# Patient Record
Sex: Male | Born: 1935 | Hispanic: No | Marital: Married | Smoking: Never smoker
Health system: Southern US, Community
[De-identification: ages and names within clinical notes are randomized; demographics above are authoritative.]

## PROBLEM LIST (undated history)

## (undated) DIAGNOSIS — I10 Essential (primary) hypertension: Secondary | ICD-10-CM

## (undated) DIAGNOSIS — I2510 Atherosclerotic heart disease of native coronary artery without angina pectoris: Secondary | ICD-10-CM

## (undated) DIAGNOSIS — I259 Chronic ischemic heart disease, unspecified: Secondary | ICD-10-CM

## (undated) DIAGNOSIS — E785 Hyperlipidemia, unspecified: Secondary | ICD-10-CM

## (undated) DIAGNOSIS — Q245 Malformation of coronary vessels: Secondary | ICD-10-CM

## (undated) DIAGNOSIS — M1990 Unspecified osteoarthritis, unspecified site: Secondary | ICD-10-CM

## (undated) DIAGNOSIS — I4891 Unspecified atrial fibrillation: Secondary | ICD-10-CM

## (undated) HISTORY — DX: Unspecified atrial fibrillation: I48.91

## (undated) HISTORY — DX: Unspecified osteoarthritis, unspecified site: M19.90

## (undated) HISTORY — PX: OTHER SURGICAL HISTORY: SHX169

## (undated) HISTORY — PX: CORONARY ARTERY BYPASS GRAFT: SHX141

## (undated) HISTORY — DX: Hyperlipidemia, unspecified: E78.5

## (undated) HISTORY — DX: Essential (primary) hypertension: I10

## (undated) HISTORY — DX: Atherosclerotic heart disease of native coronary artery without angina pectoris: I25.10

## (undated) HISTORY — DX: Malformation of coronary vessels: Q24.5

## (undated) HISTORY — DX: Chronic ischemic heart disease, unspecified: I25.9

---

## 1997-07-28 ENCOUNTER — Other Ambulatory Visit: Admission: RE | Admit: 1997-07-28 | Discharge: 1997-07-28 | Payer: Self-pay | Admitting: Cardiology

## 2001-09-14 ENCOUNTER — Ambulatory Visit (HOSPITAL_COMMUNITY): Admission: RE | Admit: 2001-09-14 | Discharge: 2001-09-14 | Payer: Self-pay | Admitting: Cardiology

## 2002-09-13 ENCOUNTER — Encounter: Payer: Self-pay | Admitting: Urology

## 2002-09-14 ENCOUNTER — Inpatient Hospital Stay (HOSPITAL_COMMUNITY): Admission: RE | Admit: 2002-09-14 | Discharge: 2002-09-16 | Payer: Self-pay | Admitting: Urology

## 2002-11-07 ENCOUNTER — Ambulatory Visit (HOSPITAL_COMMUNITY): Admission: RE | Admit: 2002-11-07 | Discharge: 2002-11-07 | Payer: Self-pay | Admitting: Cardiology

## 2002-12-06 ENCOUNTER — Ambulatory Visit (HOSPITAL_COMMUNITY): Admission: RE | Admit: 2002-12-06 | Discharge: 2002-12-06 | Payer: Self-pay | Admitting: Cardiology

## 2002-12-06 ENCOUNTER — Encounter: Payer: Self-pay | Admitting: Cardiology

## 2002-12-07 ENCOUNTER — Encounter: Payer: Self-pay | Admitting: Cardiology

## 2002-12-07 ENCOUNTER — Ambulatory Visit (HOSPITAL_COMMUNITY): Admission: RE | Admit: 2002-12-07 | Discharge: 2002-12-07 | Payer: Self-pay | Admitting: Cardiology

## 2003-01-09 ENCOUNTER — Inpatient Hospital Stay (HOSPITAL_COMMUNITY): Admission: AD | Admit: 2003-01-09 | Discharge: 2003-01-12 | Payer: Self-pay | Admitting: Cardiology

## 2003-01-09 ENCOUNTER — Encounter: Payer: Self-pay | Admitting: Cardiology

## 2003-01-20 ENCOUNTER — Emergency Department (HOSPITAL_COMMUNITY): Admission: EM | Admit: 2003-01-20 | Discharge: 2003-01-20 | Payer: Self-pay | Admitting: Emergency Medicine

## 2003-01-20 ENCOUNTER — Encounter: Payer: Self-pay | Admitting: Emergency Medicine

## 2003-01-28 ENCOUNTER — Inpatient Hospital Stay (HOSPITAL_COMMUNITY): Admission: EM | Admit: 2003-01-28 | Discharge: 2003-02-04 | Payer: Self-pay | Admitting: Emergency Medicine

## 2003-01-31 ENCOUNTER — Encounter: Payer: Self-pay | Admitting: Internal Medicine

## 2003-02-01 ENCOUNTER — Encounter: Payer: Self-pay | Admitting: Cardiology

## 2003-02-23 ENCOUNTER — Ambulatory Visit (HOSPITAL_COMMUNITY): Admission: RE | Admit: 2003-02-23 | Discharge: 2003-02-23 | Payer: Self-pay | Admitting: Internal Medicine

## 2003-02-27 ENCOUNTER — Encounter: Admission: RE | Admit: 2003-02-27 | Discharge: 2003-02-27 | Payer: Self-pay | Admitting: Internal Medicine

## 2010-05-04 ENCOUNTER — Encounter: Payer: Self-pay | Admitting: Internal Medicine

## 2010-05-16 ENCOUNTER — Ambulatory Visit (INDEPENDENT_AMBULATORY_CARE_PROVIDER_SITE_OTHER): Payer: Medicare Other | Admitting: Cardiology

## 2010-05-16 DIAGNOSIS — Z7901 Long term (current) use of anticoagulants: Secondary | ICD-10-CM

## 2010-05-16 DIAGNOSIS — I4891 Unspecified atrial fibrillation: Secondary | ICD-10-CM

## 2010-05-16 DIAGNOSIS — I253 Aneurysm of heart: Secondary | ICD-10-CM

## 2010-05-24 ENCOUNTER — Ambulatory Visit
Admission: RE | Admit: 2010-05-24 | Discharge: 2010-05-24 | Disposition: A | Discharge status: Home / Self Care | Payer: BC Managed Care – PPO | Source: Ambulatory Visit | Attending: Otolaryngology | Admitting: Otolaryngology

## 2010-05-24 ENCOUNTER — Other Ambulatory Visit: Payer: Self-pay | Admitting: Otolaryngology

## 2010-05-24 MED ORDER — GADOBENATE DIMEGLUMINE 529 MG/ML IV SOLN
14.0000 mL | Freq: Once | INTRAVENOUS | Status: AC | PRN
  Administered 2010-05-24: 14 mL via INTRAVENOUS

## 2010-09-13 ENCOUNTER — Other Ambulatory Visit: Payer: Self-pay | Admitting: *Deleted

## 2010-09-13 ENCOUNTER — Encounter: Payer: Self-pay | Admitting: Cardiology

## 2010-09-13 DIAGNOSIS — E785 Hyperlipidemia, unspecified: Secondary | ICD-10-CM

## 2010-09-16 ENCOUNTER — Other Ambulatory Visit (INDEPENDENT_AMBULATORY_CARE_PROVIDER_SITE_OTHER): Payer: BC Managed Care – PPO | Admitting: *Deleted

## 2010-09-16 ENCOUNTER — Ambulatory Visit (INDEPENDENT_AMBULATORY_CARE_PROVIDER_SITE_OTHER): Payer: BC Managed Care – PPO | Admitting: Cardiology

## 2010-09-16 ENCOUNTER — Encounter: Payer: Self-pay | Admitting: Cardiology

## 2010-09-16 ENCOUNTER — Ambulatory Visit (INDEPENDENT_AMBULATORY_CARE_PROVIDER_SITE_OTHER): Payer: BC Managed Care – PPO | Admitting: *Deleted

## 2010-09-16 DIAGNOSIS — E785 Hyperlipidemia, unspecified: Secondary | ICD-10-CM

## 2010-09-16 DIAGNOSIS — I4891 Unspecified atrial fibrillation: Secondary | ICD-10-CM

## 2010-09-16 DIAGNOSIS — I2510 Atherosclerotic heart disease of native coronary artery without angina pectoris: Secondary | ICD-10-CM

## 2010-09-16 LAB — LIPID PANEL
Cholesterol: 128 mg/dL (ref 0–200)
HDL: 54 mg/dL (ref >39.00)
Triglycerides: 65 mg/dL (ref 0.0–149.0)

## 2010-09-16 LAB — HEPATIC FUNCTION PANEL
ALT: 22 U/L (ref 0–53)
AST: 35 U/L (ref 0–37)
Albumin: 4.3 g/dL (ref 3.5–5.2)
Alkaline Phosphatase: 56 U/L (ref 39–117)
Total Protein: 7.2 g/dL (ref 6.0–8.3)

## 2010-09-16 LAB — BASIC METABOLIC PANEL
Calcium: 8.7 mg/dL (ref 8.4–10.5)
Chloride: 98 mEq/L (ref 96–112)
Creatinine, Ser: 0.9 mg/dL (ref 0.4–1.5)
Sodium: 133 mEq/L — ABNORMAL LOW (ref 135–145)

## 2010-09-16 NOTE — Progress Notes (Signed)
Subjective:   Adrian Austin is seen today for followup visit. In general, he's continued to do well and recently has played golf. He's not had any chest pain. He has a history of atrial fibrillation with controlled ventricular response and has done well on his Coumadin anticoagulation. 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 He does have a history of hypertension is controlled as well as mild osteoarthritis  Current Outpatient Prescriptions  Medication Sig Dispense Refill  . alendronate (FOSAMAX) 70 MG tablet Take 70 mg by mouth every 7 (seven) days. Take with a full glass of water on an empty stomach.       . ezetimibe (ZETIA) 10 MG tablet Take 10 mg by mouth daily.        . furosemide (LASIX) 80 MG tablet Take 80 mg by mouth as needed.        . lansoprazole (PREVACID) 30 MG capsule Take 30 mg by mouth daily.        . metoprolol (TOPROL-XL) 50 MG 24 hr tablet Take 50 mg by mouth daily. 2 IN THE AM, 1 IN THE PM       . Multiple Vitamin (MULTIVITAMIN) tablet Take 1 tablet by mouth daily.        . potassium chloride SA (K-DUR,KLOR-CON) 20 MEQ tablet Take 20 mEq by mouth 2 (two) times daily.        . rosuvastatin (CRESTOR) 5 MG tablet Take 5 mg by mouth daily.        Marland Kitchen warfarin (COUMADIN) 5 MG tablet Take 5 mg by mouth as directed.          Allergies  Allergen Reactions  . Lipitor (Atorvastatin Calcium)   . Mevacor (Lovastatin)   . Penicillins   . Pravachol   . Sulfa Drugs Cross Reactors     Patient Active Problem List  Diagnoses  . Atrial fibrillation    History  Smoking status  . Never Smoker   Smokeless tobacco  . Never Used    History  Alcohol Use No    Family History  Problem Relation Age of Onset  . Colon cancer Mother   . Heart disease Father     Review of Systems:   The patient denies any heat or cold intolerance.  No weight gain or weight loss.  The patient denies headaches or blurry  vision.  There is no cough or sputum production.  The patient denies dizziness.  There is no hematuria or hematochezia.  The patient denies any muscle aches or arthritis.  The patient denies any rash.  The patient denies frequent falling or instability.  There is no history of depression or anxiety.  All other systems were reviewed and are negative.   Physical Exam:   Vital signs are reviewed.The head is normocephalic and atraumatic.  Pupils are equally round and reactive to light.  Sclerae nonicteric.  Conjunctiva is clear.  Oropharynx is unremarkable.  There's adequate oral airway.  Neck is supple there are no masses.  Thyroid is not enlarged.  There is no lymphadenopathy.  Lungs are clear.  Chest is symmetric.  Heart shows an  irregular rate and rhythm.  S1 and S2 are normal.  There is no murmur click or gallop.  Abdomen is soft normal bowel sounds.  There is no organomegaly.  Genital and rectal deferred.  Extremities are without edema.  Peripheral pulses are adequate.  Neurologically intact.  Full range  of motion.  The patient is not depressed.  Skin is warm and dry.  Assessment / Plan:

## 2010-09-17 DIAGNOSIS — I2510 Atherosclerotic heart disease of native coronary artery without angina pectoris: Secondary | ICD-10-CM | POA: Insufficient documentation

## 2010-09-17 NOTE — Assessment & Plan Note (Signed)
I'll have him see Dr. Swaziland in 6 months. Overall, he does amazingly well. He's had some mild hyponatremia in the past but now has normal renal function.

## 2010-09-17 NOTE — Assessment & Plan Note (Signed)
He remains in atrial fibrillation with controlled ventricular response and he manages his warfarin at home. Currently takes approximately 14 mg of warfarin per week and has been able to keep his INR in approximately the 2.0-2.5 range.

## 2010-09-18 ENCOUNTER — Telehealth: Payer: Self-pay | Admitting: *Deleted

## 2010-09-18 NOTE — Telephone Encounter (Signed)
Message copied by Lorayne Bender on Wed Sep 18, 2010 11:32 AM ------      Message from: Roger Shelter      Created: Wed Sep 18, 2010  8:42 AM       Ok, recheck in six months.

## 2010-09-18 NOTE — Telephone Encounter (Signed)
Notified of lab results. Will be followed by Dr. Swaziland in Dec and will recheck labs then.

## 2011-04-28 ENCOUNTER — Encounter: Payer: Self-pay | Admitting: Cardiology

## 2011-04-28 ENCOUNTER — Ambulatory Visit (INDEPENDENT_AMBULATORY_CARE_PROVIDER_SITE_OTHER): Payer: BC Managed Care – PPO | Admitting: Cardiology

## 2011-04-28 VITALS — BP 140/84 | HR 83 | Ht 70.0 in | Wt 166.4 lb

## 2011-04-28 DIAGNOSIS — Z951 Presence of aortocoronary bypass graft: Secondary | ICD-10-CM

## 2011-04-28 DIAGNOSIS — I10 Essential (primary) hypertension: Secondary | ICD-10-CM | POA: Insufficient documentation

## 2011-04-28 DIAGNOSIS — E785 Hyperlipidemia, unspecified: Secondary | ICD-10-CM

## 2011-04-28 DIAGNOSIS — I4891 Unspecified atrial fibrillation: Secondary | ICD-10-CM

## 2011-04-28 DIAGNOSIS — I2510 Atherosclerotic heart disease of native coronary artery without angina pectoris: Secondary | ICD-10-CM

## 2011-04-28 NOTE — Assessment & Plan Note (Signed)
His rate is well controlled and he is asymptomatic. He is on Coumadin chronically but it concerns me that he adjusts his dose on his on and doesn't have his blood checked as frequently as he should. I stressed the importance of regular followup. He does this with his primary care in [place].

## 2011-04-28 NOTE — Assessment & Plan Note (Signed)
He remains asymptomatic. I would not recommend a routine stress testing given his advanced age. Continue with his medical therapy and risk factor modification. I'll followup again in 6 months and we will check fasting lab work at that time.

## 2011-04-28 NOTE — Assessment & Plan Note (Signed)
Blood pressure is well controlled on current medications. 

## 2011-04-28 NOTE — Progress Notes (Signed)
Subjective:   Adrian Austin is seen today to establish cardiac care. He is a former patient of Dr. Deborah Chalk. In general, he's continued to do well. He's not had any chest pain. He has a history of atrial fibrillation with controlled ventricular response and has done well on his Coumadin anticoagulation. He does not get his INRs checked regularly. He does have it checked periodically with his primary physician in [place]. He has not had recurrent chest pain. 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 His LAD and left circumflex coronary arise anomalously from the right coronary cusp. He does have a small intermediate branch which arises from the left coronary cusp. His bypass surgery included a saphenous vein graft to the diagonal, saphenous vein graft to the left circumflex, LIMA graft to the LAD, and saphenous vein graft to the acute marginal of the right coronary. The same graft was occluded to the distal right coronary. He does have a history of hypertension is controlled as well as mild osteoarthritis. His son-in-law is Dr. Darvin Neighbours.  Current Outpatient Prescriptions  Medication Sig Dispense Refill  . alendronate (FOSAMAX) 70 MG tablet Take 70 mg by mouth every 7 (seven) days. Take with a full glass of water on an empty stomach.       . ezetimibe (ZETIA) 10 MG tablet Take 10 mg by mouth daily.        . furosemide (LASIX) 80 MG tablet Take 80 mg by mouth as needed.        . lansoprazole (PREVACID) 30 MG capsule Take 30 mg by mouth daily.        . metoprolol (TOPROL-XL) 50 MG 24 hr tablet Take 50 mg by mouth daily. 2 IN THE AM, 1 IN THE PM       . Multiple Vitamin (MULTIVITAMIN) tablet Take 1 tablet by mouth daily.        . potassium chloride SA (K-DUR,KLOR-CON) 20 MEQ tablet Take 20 mEq by mouth 2 (two) times daily.        . rosuvastatin (CRESTOR) 5 MG tablet Take 5 mg by mouth daily.        Marland Kitchen warfarin (COUMADIN) 5 MG tablet Take 5 mg by mouth as directed.           Allergies  Allergen Reactions  . Lipitor (Atorvastatin Calcium)   . Mevacor (Lovastatin)   . Penicillins   . Pravachol   . Sulfa Drugs Cross Reactors     Patient Active Problem List  Diagnoses  . Atrial fibrillation  . CAD (coronary artery disease)    History  Smoking status  . Never Smoker   Smokeless tobacco  . Never Used    History  Alcohol Use No    Family History  Problem Relation Age of Onset  . Colon cancer Mother   . Heart disease Father     Review of Systems:   The patient denies any heat or cold intolerance.  No weight gain or weight loss.  The patient denies headaches or blurry vision.  There is no cough or sputum production.  The patient denies dizziness.  There is no hematuria or hematochezia.  The patient denies any muscle aches or arthritis.  The patient denies any rash.  The patient denies frequent falling or instability.  There is no history of depression or anxiety.  All other systems were reviewed and are negative.   Physical Exam:   Vital signs are reviewed.The head is normocephalic and  atraumatic.  Pupils are equally round and reactive to light.  Sclerae nonicteric.  Conjunctiva is clear.  Oropharynx is unremarkable.  There's adequate oral airway.  Neck is supple there are no masses.  Thyroid is not enlarged.  There is no lymphadenopathy.  Lungs are clear.  Chest is symmetric.  Heart shows an  irregular rate and rhythm.  S1 and S2 are normal.  There is no murmur click or gallop.  Abdomen is soft normal bowel sounds.  There is no organomegaly.  Genital and rectal deferred.  Extremities are without edema.  Peripheral pulses are adequate.  Neurologically intact.  Full range of motion.  The patient is not depressed.  Skin is warm and dry.  Laboratory data: ECG demonstrates atrial fibrillation with frequent PVCs. 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 This is unchanged compared  to May of 2010. Assessment / Plan:

## 2011-04-28 NOTE — Assessment & Plan Note (Signed)
Blood work in June of 2012 looks satisfactory. We will plan on checking fasting lab work on his next visit.

## 2011-04-28 NOTE — Patient Instructions (Signed)
Continue your current medications.  I will see you again in 6 months with fasting.

## 2011-10-27 ENCOUNTER — Other Ambulatory Visit: Payer: BC Managed Care – PPO

## 2012-01-14 ENCOUNTER — Encounter: Payer: Self-pay | Admitting: Cardiology

## 2012-01-14 ENCOUNTER — Other Ambulatory Visit: Payer: Self-pay

## 2012-01-14 ENCOUNTER — Ambulatory Visit (INDEPENDENT_AMBULATORY_CARE_PROVIDER_SITE_OTHER): Payer: BC Managed Care – PPO | Admitting: Cardiology

## 2012-01-14 VITALS — BP 118/58 | HR 55 | Ht 70.0 in | Wt 161.8 lb

## 2012-01-14 DIAGNOSIS — I4891 Unspecified atrial fibrillation: Secondary | ICD-10-CM

## 2012-01-14 DIAGNOSIS — E785 Hyperlipidemia, unspecified: Secondary | ICD-10-CM

## 2012-01-14 DIAGNOSIS — I10 Essential (primary) hypertension: Secondary | ICD-10-CM

## 2012-01-14 DIAGNOSIS — I2510 Atherosclerotic heart disease of native coronary artery without angina pectoris: Secondary | ICD-10-CM

## 2012-01-14 NOTE — Progress Notes (Signed)
Subjective:   Adrian Austin is seen today for 6 month followup.  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 His LAD and left circumflex coronary arise anomalously from the right coronary cusp. He does have a small intermediate branch which arises from the left coronary cusp. His bypass surgery included a saphenous vein graft to the diagonal, saphenous vein graft to the left circumflex, LIMA graft to the LAD, and saphenous vein graft to the acute marginal of the right coronary. The same graft was occluded to the distal right coronary. He does have a history of hypertension is controlled as well as mild osteoarthritis. He has a history of atrial fibrillation and has been on chronic Coumadin. He has this checked sporadically by his primary care physician in [place]. His son-in-law is Dr. Darvin Neighbours. He currently denies any chest pain, palpitations, shortness of breath, or increased edema. He states he is taking his Lasix daily.  Current Outpatient Prescriptions  Medication Sig Dispense Refill  . alendronate (FOSAMAX) 70 MG tablet Take 70 mg by mouth every 7 (seven) days. Take with a full glass of water on an empty stomach.       . ezetimibe (ZETIA) 10 MG tablet Take 10 mg by mouth daily.        . furosemide (LASIX) 80 MG tablet Take 80 mg by mouth as needed.        . lansoprazole (PREVACID) 30 MG capsule Take 30 mg by mouth daily.        . metoprolol (TOPROL-XL) 50 MG 24 hr tablet Take 50 mg by mouth daily. 2 IN THE AM, 1 IN THE PM       . Multiple Vitamin (MULTIVITAMIN) tablet Take 1 tablet by mouth daily.        . potassium chloride SA (K-DUR,KLOR-CON) 20 MEQ tablet Take 20 mEq by mouth 2 (two) times daily.        . rosuvastatin (CRESTOR) 5 MG tablet Take 5 mg by mouth daily.        Marland Kitchen warfarin (COUMADIN) 5 MG tablet Take 5 mg by mouth as directed.          Allergies  Allergen Reactions  . Lipitor (Atorvastatin Calcium)   . Mevacor (Lovastatin)   . Penicillins    . Pravachol   . Sulfa Drugs Cross Reactors     Patient Active Problem List  Diagnosis  . Atrial fibrillation  . CAD (coronary artery disease)  . Hyperlipidemia  . HTN (hypertension)    History  Smoking status  . Never Smoker   Smokeless tobacco  . Never Used    History  Alcohol Use No    Family History  Problem Relation Age of Onset  . Colon cancer Mother   . Heart disease Father     Review of Systems:   The review of systems is positive for chronic right knee pain due to arthritis.  All other systems were reviewed and are negative.   Physical Exam:   BP 118/58  Pulse 55  Ht 5\' 10"  (1.778 m)  Wt 161 lb 12.8 oz (73.392 kg)  BMI 23.22 kg/m2  SpO2 98% The head is normocephalic and atraumatic.  Pupils are equally round and reactive to light.  Sclerae nonicteric.  Conjunctiva is clear.  Oropharynx is unremarkable.  There's adequate oral airway.  Neck is supple there are no masses.  Thyroid is not enlarged.  There is no lymphadenopathy.  Lungs are clear.  Chest is  symmetric.  Heart shows an  irregular rate and rhythm.  S1 and S2 are normal.  There is no murmur click or gallop.  Abdomen is soft normal bowel sounds.   Extremities are without edema.  Peripheral pulses are adequate.  Neurologically intact.  Full range of motion.  The patient is not depressed.  Skin is warm and dry.  Laboratory data:    Assessment / Plan: 1. Coronary disease status post CABG for anomalous coronary anatomy. Patient remains asymptomatic.  2. Atrial fibrillation. Rate is well controlled. He is on Coumadin. Have encouraged him to get his INR checked regularly.  3. Hypertension, controlled.  4. Hyperlipidemia. Patient will return tomorrow for fasting lab work.  5. Arthritis. I told him it was okay for him to take Tylenol as needed.

## 2012-01-14 NOTE — Patient Instructions (Signed)
We will check fasting lab work and call the results.  I will see you in 6 months.

## 2012-01-15 ENCOUNTER — Other Ambulatory Visit (INDEPENDENT_AMBULATORY_CARE_PROVIDER_SITE_OTHER): Payer: BC Managed Care – PPO

## 2012-01-15 DIAGNOSIS — Z951 Presence of aortocoronary bypass graft: Secondary | ICD-10-CM

## 2012-01-15 DIAGNOSIS — I4891 Unspecified atrial fibrillation: Secondary | ICD-10-CM

## 2012-01-15 DIAGNOSIS — E785 Hyperlipidemia, unspecified: Secondary | ICD-10-CM

## 2012-01-15 DIAGNOSIS — I2510 Atherosclerotic heart disease of native coronary artery without angina pectoris: Secondary | ICD-10-CM

## 2012-01-15 LAB — CBC WITH DIFFERENTIAL/PLATELET
Basophils Relative: 0.5 % (ref 0.0–3.0)
Eosinophils Absolute: 0.2 10*3/uL (ref 0.0–0.7)
Lymphocytes Relative: 22 % (ref 12.0–46.0)
MCHC: 33.1 g/dL (ref 30.0–36.0)
MCV: 93.6 fl (ref 78.0–100.0)
Monocytes Absolute: 0.8 10*3/uL (ref 0.1–1.0)
Neutrophils Relative %: 64.9 % (ref 43.0–77.0)
Platelets: 178 10*3/uL (ref 150.0–400.0)
RBC: 4.96 Mil/uL (ref 4.22–5.81)
WBC: 8.4 10*3/uL (ref 4.5–10.5)

## 2012-01-15 LAB — BASIC METABOLIC PANEL
Chloride: 99 mEq/L (ref 96–112)
GFR: 91.95 mL/min (ref >60.00)
Glucose, Bld: 102 mg/dL — ABNORMAL HIGH (ref 70–99)
Potassium: 4.2 mEq/L (ref 3.5–5.1)
Sodium: 132 mEq/L — ABNORMAL LOW (ref 135–145)

## 2012-01-15 LAB — HEPATIC FUNCTION PANEL
ALT: 20 U/L (ref 0–53)
AST: 35 U/L (ref 0–37)
Total Bilirubin: 1.5 mg/dL — ABNORMAL HIGH (ref 0.3–1.2)
Total Protein: 7.4 g/dL (ref 6.0–8.3)

## 2012-01-15 LAB — LIPID PANEL
Cholesterol: 164 mg/dL (ref 0–200)
HDL: 47.4 mg/dL (ref >39.00)
Triglycerides: 55 mg/dL (ref 0.0–149.0)

## 2012-01-16 ENCOUNTER — Other Ambulatory Visit: Payer: Self-pay

## 2012-06-10 ENCOUNTER — Other Ambulatory Visit: Payer: Self-pay | Admitting: *Deleted

## 2012-06-10 MED ORDER — EZETIMIBE 10 MG PO TABS: 10.0000 mg | ORAL_TABLET | Freq: Every day | ORAL | Status: DC

## 2012-06-10 MED ORDER — POTASSIUM CHLORIDE CRYS ER 20 MEQ PO TBCR: 20.0000 meq | EXTENDED_RELEASE_TABLET | Freq: Two times a day (BID) | ORAL | Status: DC

## 2012-06-10 MED ORDER — ROSUVASTATIN CALCIUM 5 MG PO TABS: 5.0000 mg | ORAL_TABLET | Freq: Every day | ORAL | Status: DC

## 2012-09-24 ENCOUNTER — Other Ambulatory Visit: Payer: Self-pay | Admitting: *Deleted

## 2012-09-24 MED ORDER — POTASSIUM CHLORIDE CRYS ER 20 MEQ PO TBCR: 20.0000 meq | EXTENDED_RELEASE_TABLET | Freq: Two times a day (BID) | ORAL | Status: DC

## 2012-11-24 ENCOUNTER — Encounter: Payer: Self-pay | Admitting: Cardiology

## 2012-11-24 ENCOUNTER — Other Ambulatory Visit: Payer: Self-pay

## 2012-11-24 ENCOUNTER — Ambulatory Visit (INDEPENDENT_AMBULATORY_CARE_PROVIDER_SITE_OTHER): Payer: BC Managed Care – PPO | Admitting: Cardiology

## 2012-11-24 VITALS — BP 130/78 | HR 71 | Ht 70.0 in | Wt 150.4 lb

## 2012-11-24 DIAGNOSIS — I2510 Atherosclerotic heart disease of native coronary artery without angina pectoris: Secondary | ICD-10-CM

## 2012-11-24 DIAGNOSIS — I10 Essential (primary) hypertension: Secondary | ICD-10-CM

## 2012-11-24 DIAGNOSIS — I4891 Unspecified atrial fibrillation: Secondary | ICD-10-CM

## 2012-11-24 DIAGNOSIS — E785 Hyperlipidemia, unspecified: Secondary | ICD-10-CM

## 2012-11-24 LAB — BASIC METABOLIC PANEL
BUN: 17 mg/dL (ref 6–23)
Calcium: 9.2 mg/dL (ref 8.4–10.5)
GFR: 88.09 mL/min (ref >60.00)
Potassium: 4.3 mEq/L (ref 3.5–5.1)
Sodium: 131 mEq/L — ABNORMAL LOW (ref 135–145)

## 2012-11-24 LAB — CBC WITH DIFFERENTIAL/PLATELET
Basophils Absolute: 0 10*3/uL (ref 0.0–0.1)
Eosinophils Absolute: 0.1 10*3/uL (ref 0.0–0.7)
Lymphocytes Relative: 16.1 % (ref 12.0–46.0)
Lymphs Abs: 1.6 10*3/uL (ref 0.7–4.0)
MCHC: 33.2 g/dL (ref 30.0–36.0)
Monocytes Relative: 8.8 % (ref 3.0–12.0)
Platelets: 189 10*3/uL (ref 150.0–400.0)
RDW: 13.7 % (ref 11.5–14.6)

## 2012-11-24 LAB — LIPID PANEL
Cholesterol: 176 mg/dL (ref 0–200)
VLDL: 12.4 mg/dL (ref 0.0–40.0)

## 2012-11-24 LAB — HEPATIC FUNCTION PANEL
ALT: 17 U/L (ref 0–53)
AST: 30 U/L (ref 0–37)
Bilirubin, Direct: 0.2 mg/dL (ref 0.0–0.3)
Total Bilirubin: 1.5 mg/dL — ABNORMAL HIGH (ref 0.3–1.2)
Total Protein: 7.7 g/dL (ref 6.0–8.3)

## 2012-11-24 LAB — PROTIME-INR: Prothrombin Time: 12.1 s (ref 10.2–12.4)

## 2012-11-24 MED ORDER — APIXABAN 5 MG PO TABS: 5.0000 mg | ORAL_TABLET | Freq: Two times a day (BID) | ORAL | Status: DC

## 2012-11-24 NOTE — Progress Notes (Signed)
Subjective:   Adrian Austin is seen today for 6 month followup.  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 His LAD and left circumflex coronary arise anomalously from the right coronary cusp. He does have a small intermediate branch which arises from the left coronary cusp. His bypass surgery included a saphenous vein graft to the diagonal, saphenous vein graft to the left circumflex, LIMA graft to the LAD, and saphenous vein graft to the acute marginal of the right coronary. The same graft was occluded to the distal right coronary. He does have a history of hypertension is controlled as well as mild osteoarthritis. He has a history of atrial fibrillation and has been on chronic Coumadin.  His son-in-law is Dr. Darvin Austin. He currently denies any chest pain, palpitations, shortness of breath, or increased edema. According to his daughter he is not getting his Coumadin checked.  Current Outpatient Prescriptions  Medication Sig Dispense Refill  . alendronate (FOSAMAX) 70 MG tablet Take 70 mg by mouth every 7 (seven) days. Take with a full glass of water on an empty stomach.       . ezetimibe (ZETIA) 10 MG tablet Take 1 tablet (10 mg total) by mouth daily.  30 tablet  2  . furosemide (LASIX) 40 MG tablet Take 40 mg daily if needed  30 tablet  6  . lansoprazole (PREVACID) 30 MG capsule Take 30 mg by mouth daily.        . metoprolol (TOPROL-XL) 50 MG 24 hr tablet Take 50 mg by mouth daily. 2 IN THE AM, 1 IN THE PM       . Multiple Vitamin (MULTIVITAMIN) tablet Take 1 tablet by mouth daily.        . potassium chloride SA (K-DUR,KLOR-CON) 20 MEQ tablet Take 1 tablet (20 mEq total) by mouth 2 (two) times daily.  60 tablet  2  . rosuvastatin (CRESTOR) 5 MG tablet Take 1 tablet (5 mg total) by mouth daily.  30 tablet  2  . apixaban (ELIQUIS) 5 MG TABS tablet Take 1 tablet (5 mg total) by mouth 2 (two) times daily.  60 tablet  11   No current facility-administered  medications for this visit.    Allergies  Allergen Reactions  . Lipitor [Atorvastatin Calcium]   . Mevacor [Lovastatin]   . Penicillins   . Pravachol   . Sulfa Drugs Cross Reactors     Patient Active Problem List   Diagnosis Date Noted  . Hyperlipidemia 04/28/2011  . HTN (hypertension) 04/28/2011  . CAD (coronary artery disease) 09/17/2010  . Atrial fibrillation 09/16/2010    History  Smoking status  . Never Smoker   Smokeless tobacco  . Never Used    History  Alcohol Use No    Family History  Problem Relation Age of Onset  . Colon cancer Mother   . Heart disease Father     Review of Systems:   The review of systems is positive for chronic right knee pain due to arthritis. His daughter reports that his gait is less daily. He is hard of hearing. All other systems were reviewed and are negative.   Physical Exam:   BP 130/78  Pulse 71  Ht 5\' 10"  (1.778 m)  Wt 150 lb 6.4 oz (68.221 kg)  BMI 21.58 kg/m2 The head is normocephalic and atraumatic.  Pupils are equally round and reactive to light.  Sclerae nonicteric.  Conjunctiva is clear.  Oropharynx is unremarkable.  There's adequate  oral airway.  Neck is supple there are no masses.  Thyroid is not enlarged.  There is no lymphadenopathy.  Lungs are clear.  Chest is symmetric.  Heart shows an  irregular rate and rhythm.  S1 and S2 are normal.  There is no murmur click or gallop.  Abdomen is soft normal bowel sounds.   Extremities are without edema.  Peripheral pulses are adequate.  Neurologically intact.  Full range of motion.  The patient is not depressed.  Skin is warm and dry.  Laboratory data:  ECG today demonstrates atrial fibrillation with a rate of 71 beats per minute. He has ST-T wave changes consistent with lateral ischemia. There is left axis deviation and possible old septal infarct.  Assessment / Plan: 1. Coronary disease status post CABG for anomalous coronary anatomy. Patient remains asymptomatic.  2.  Atrial fibrillation. Rate is well controlled. He is on Coumadin. He has not had regular followup. We will check his INR today. I recommended switching him to a novel anticoagulant and we'll start him on Eliquis 5 mg twice a day depending on his INR level today.  3. Hypertension, controlled.  4. Hyperlipidemia. We'll check fasting lab work today.  5. Arthritis.

## 2012-11-24 NOTE — Patient Instructions (Signed)
We will check lab work today.  We will instruct you when to stop coumadin and start Eliquis 5 mg bid  Continue your other therapy

## 2012-12-20 ENCOUNTER — Telehealth: Payer: Self-pay | Admitting: Cardiology

## 2012-12-20 NOTE — Telephone Encounter (Signed)
Have you received a form from [employer] regarding my father's  Eliquis.   Please give me a call.

## 2012-12-20 NOTE — Telephone Encounter (Signed)
Returned call to patient's daughter Lanora Manis she stated she wanted to make sure [employer] faxed a prior authorization today.Message sent to Addison Lank RN.

## 2012-12-21 ENCOUNTER — Telehealth: Payer: Self-pay | Admitting: Cardiology

## 2012-12-21 NOTE — Telephone Encounter (Signed)
New Problem   Pt request a call back to discuss medications .

## 2012-12-21 NOTE — Telephone Encounter (Signed)
Spoke with [facility], they will not approve eliquis over phone, they will fax me form to be completed.

## 2012-12-21 NOTE — Telephone Encounter (Signed)
Returned call to patient's daughter Adrian Austin she stated father's insurance requires a form to be completed before they will approve Eliquis.[place] [employer] form completed and faxed to [phone].When reviewing patient's chart noticed 2 electronic charts for patient.[facility] was notified.

## 2012-12-23 ENCOUNTER — Other Ambulatory Visit: Payer: Self-pay | Admitting: *Deleted

## 2012-12-23 DIAGNOSIS — I4891 Unspecified atrial fibrillation: Secondary | ICD-10-CM

## 2012-12-23 MED ORDER — APIXABAN 5 MG PO TABS: 5.0000 mg | ORAL_TABLET | Freq: Two times a day (BID) | ORAL | Status: DC

## 2012-12-23 MED ORDER — POTASSIUM CHLORIDE CRYS ER 20 MEQ PO TBCR: 20.0000 meq | EXTENDED_RELEASE_TABLET | Freq: Two times a day (BID) | ORAL | Status: DC

## 2012-12-23 MED ORDER — EZETIMIBE 10 MG PO TABS: 10.0000 mg | ORAL_TABLET | Freq: Every day | ORAL | Status: DC

## 2012-12-23 NOTE — Telephone Encounter (Signed)
Adrian Austin to complete these forms

## 2013-01-18 ENCOUNTER — Other Ambulatory Visit: Payer: Self-pay | Admitting: *Deleted

## 2013-01-18 MED ORDER — FUROSEMIDE 40 MG PO TABS: ORAL_TABLET | ORAL | Status: DC

## 2013-01-26 ENCOUNTER — Other Ambulatory Visit: Payer: Self-pay

## 2013-01-26 MED ORDER — ROSUVASTATIN CALCIUM 5 MG PO TABS: 5.0000 mg | ORAL_TABLET | Freq: Every day | ORAL | Status: DC

## 2013-02-15 ENCOUNTER — Other Ambulatory Visit: Payer: Self-pay | Admitting: *Deleted

## 2013-02-15 MED ORDER — METOPROLOL SUCCINATE ER 50 MG PO TB24: 50.0000 mg | ORAL_TABLET | Freq: Every day | ORAL | Status: DC

## 2013-08-02 ENCOUNTER — Other Ambulatory Visit: Payer: Self-pay

## 2013-08-02 MED ORDER — FUROSEMIDE 40 MG PO TABS: ORAL_TABLET | ORAL | Status: DC

## 2013-08-29 ENCOUNTER — Other Ambulatory Visit: Payer: Self-pay | Admitting: *Deleted

## 2013-08-29 MED ORDER — ROSUVASTATIN CALCIUM 5 MG PO TABS: 5.0000 mg | ORAL_TABLET | Freq: Every day | ORAL | Status: DC

## 2013-08-29 MED ORDER — METOPROLOL SUCCINATE ER 50 MG PO TB24: ORAL_TABLET | ORAL | Status: DC

## 2013-11-18 ENCOUNTER — Other Ambulatory Visit: Payer: Self-pay

## 2013-11-18 ENCOUNTER — Other Ambulatory Visit: Payer: Self-pay | Admitting: Cardiology

## 2013-11-18 MED ORDER — METOPROLOL SUCCINATE ER 50 MG PO TB24: ORAL_TABLET | ORAL | Status: DC

## 2013-11-18 MED ORDER — FUROSEMIDE 40 MG PO TABS: ORAL_TABLET | ORAL | Status: DC

## 2013-11-18 MED ORDER — METOPROLOL TARTRATE 50 MG PO TABS: ORAL_TABLET | ORAL | Status: DC

## 2013-11-18 MED ORDER — ROSUVASTATIN CALCIUM 5 MG PO TABS: 5.0000 mg | ORAL_TABLET | Freq: Every day | ORAL | Status: AC

## 2013-11-18 MED ORDER — APIXABAN 5 MG PO TABS: 5.0000 mg | ORAL_TABLET | Freq: Two times a day (BID) | ORAL | Status: DC

## 2013-11-18 MED ORDER — METOPROLOL SUCCINATE ER 50 MG PO TB24: ORAL_TABLET | ORAL | Status: AC

## 2013-11-18 MED ORDER — ROSUVASTATIN CALCIUM 5 MG PO TABS: 5.0000 mg | ORAL_TABLET | Freq: Every day | ORAL | Status: DC

## 2013-11-18 MED ORDER — EZETIMIBE 10 MG PO TABS: 10.0000 mg | ORAL_TABLET | Freq: Every day | ORAL | Status: DC

## 2013-11-18 MED ORDER — POTASSIUM CHLORIDE CRYS ER 20 MEQ PO TBCR: 20.0000 meq | EXTENDED_RELEASE_TABLET | Freq: Two times a day (BID) | ORAL | Status: DC

## 2013-11-18 MED ORDER — EZETIMIBE 10 MG PO TABS: 10.0000 mg | ORAL_TABLET | Freq: Every day | ORAL | Status: AC

## 2013-11-18 MED ORDER — FUROSEMIDE 40 MG PO TABS
40.0000 mg | ORAL_TABLET | Freq: Every day | ORAL | Status: AC | PRN
Start: 2013-11-18 — End: ?

## 2013-11-18 MED ORDER — POTASSIUM CHLORIDE ER 20 MEQ PO TBCR: 20.0000 meq | EXTENDED_RELEASE_TABLET | Freq: Two times a day (BID) | ORAL | Status: DC

## 2014-01-09 ENCOUNTER — Other Ambulatory Visit: Payer: Self-pay | Admitting: *Deleted

## 2014-01-09 MED ORDER — APIXABAN 5 MG PO TABS: 5.0000 mg | ORAL_TABLET | Freq: Two times a day (BID) | ORAL | Status: AC

## 2014-04-03 ENCOUNTER — Other Ambulatory Visit: Payer: Self-pay | Admitting: Cardiology

## 2015-04-06 ENCOUNTER — Emergency Department (HOSPITAL_COMMUNITY)
Admission: EM | Admit: 2015-04-06 | Discharge: 2015-04-06 | Disposition: A | Discharge status: Home / Self Care | Payer: Medicare Other | Attending: Emergency Medicine | Admitting: Emergency Medicine

## 2015-04-06 ENCOUNTER — Encounter (HOSPITAL_COMMUNITY): Payer: Self-pay

## 2015-04-06 ENCOUNTER — Emergency Department (HOSPITAL_COMMUNITY): Payer: Medicare Other

## 2015-04-06 DIAGNOSIS — I2510 Atherosclerotic heart disease of native coronary artery without angina pectoris: Secondary | ICD-10-CM | POA: Diagnosis not present

## 2015-04-06 DIAGNOSIS — S0990XA Unspecified injury of head, initial encounter: Secondary | ICD-10-CM

## 2015-04-06 DIAGNOSIS — Z79899 Other long term (current) drug therapy: Secondary | ICD-10-CM | POA: Diagnosis not present

## 2015-04-06 DIAGNOSIS — Z880 Allergy status to penicillin: Secondary | ICD-10-CM | POA: Insufficient documentation

## 2015-04-06 DIAGNOSIS — S0101XA Laceration without foreign body of scalp, initial encounter: Secondary | ICD-10-CM | POA: Diagnosis not present

## 2015-04-06 DIAGNOSIS — Z23 Encounter for immunization: Secondary | ICD-10-CM | POA: Diagnosis not present

## 2015-04-06 DIAGNOSIS — I10 Essential (primary) hypertension: Secondary | ICD-10-CM | POA: Diagnosis not present

## 2015-04-06 DIAGNOSIS — Y92129 Unspecified place in nursing home as the place of occurrence of the external cause: Secondary | ICD-10-CM | POA: Insufficient documentation

## 2015-04-06 DIAGNOSIS — R55 Syncope and collapse: Secondary | ICD-10-CM | POA: Insufficient documentation

## 2015-04-06 DIAGNOSIS — I4891 Unspecified atrial fibrillation: Secondary | ICD-10-CM | POA: Diagnosis not present

## 2015-04-06 DIAGNOSIS — W19XXXA Unspecified fall, initial encounter: Secondary | ICD-10-CM | POA: Diagnosis not present

## 2015-04-06 DIAGNOSIS — Q245 Malformation of coronary vessels: Secondary | ICD-10-CM | POA: Diagnosis not present

## 2015-04-06 DIAGNOSIS — Z951 Presence of aortocoronary bypass graft: Secondary | ICD-10-CM | POA: Insufficient documentation

## 2015-04-06 DIAGNOSIS — Y999 Unspecified external cause status: Secondary | ICD-10-CM | POA: Insufficient documentation

## 2015-04-06 DIAGNOSIS — F0390 Unspecified dementia without behavioral disturbance: Secondary | ICD-10-CM | POA: Diagnosis not present

## 2015-04-06 DIAGNOSIS — Y9389 Activity, other specified: Secondary | ICD-10-CM | POA: Diagnosis not present

## 2015-04-06 DIAGNOSIS — Z8639 Personal history of other endocrine, nutritional and metabolic disease: Secondary | ICD-10-CM | POA: Diagnosis not present

## 2015-04-06 DIAGNOSIS — Z7901 Long term (current) use of anticoagulants: Secondary | ICD-10-CM | POA: Diagnosis not present

## 2015-04-06 DIAGNOSIS — M1711 Unilateral primary osteoarthritis, right knee: Secondary | ICD-10-CM | POA: Diagnosis not present

## 2015-04-06 DIAGNOSIS — IMO0002 Reserved for concepts with insufficient information to code with codable children: Secondary | ICD-10-CM

## 2015-04-06 MED ORDER — TETANUS-DIPHTH-ACELL PERTUSSIS 5-2.5-18.5 LF-MCG/0.5 IM SUSP
0.5000 mL | Freq: Once | INTRAMUSCULAR | Status: AC
  Administered 2015-04-06: 0.5 mL via INTRAMUSCULAR
  Filled 2015-04-06: qty 0.5

## 2015-04-06 MED ORDER — LIDOCAINE-EPINEPHRINE (PF) 2 %-1:200000 IJ SOLN
10.0000 mL | Freq: Once | INTRAMUSCULAR | Status: AC
  Administered 2015-04-06: 10 mL
  Filled 2015-04-06: qty 20

## 2015-04-06 NOTE — ED Notes (Signed)
Pt was able to ambulate in hallway with walker with steady gait, stand by assist.

## 2015-04-06 NOTE — ED Notes (Signed)
Dr. Rancour at bedside. 

## 2015-04-06 NOTE — ED Provider Notes (Signed)
..  Laceration Repair Date/Time: 04/06/2015 8:45 PM Performed by: Arthor CaptainHARRIS, Adrian Austin Authorized by: Arthor CaptainHARRIS, Adrian Austin Consent: Verbal consent obtained. Consent given by: guardian Patient identity confirmed: provided demographic data Time out: Immediately prior to procedure a "time out" was called to verify the correct patient, procedure, equipment, support staff and site/side marked as required. Body area: head/neck Location details: scalp Laceration length: 3 cm Foreign bodies: no foreign bodies Tendon involvement: none Nerve involvement: none Vascular damage: no Irrigation solution: saline Amount of cleaning: standard Debridement: none Skin closure: staples Number of sutures: 2 Patient tolerance: Patient tolerated the procedure well with no immediate complications     Arthor Captainbigail Ansleigh Safer, PA-C 04/07/15 [id number]  Adrian OctaveStephen Rancour, MD 04/07/15 [phone]

## 2015-04-06 NOTE — Discharge Instructions (Signed)
Head Injury, Adult Follow-up for suture removal in one week. Return to the ED sooner if you develop worsening headache, confusion, vomiting or any other concerns. You have received a head injury. It does not appear serious at this time. Headaches and vomiting are common following head injury. It should be easy to awaken from sleeping. Sometimes it is necessary for you to stay in the emergency department for a while for observation. Sometimes admission to the hospital may be needed. After injuries such as yours, most problems occur within the first 24 hours, but side effects may occur up to 7-10 days after the injury. It is important for you to carefully monitor your condition and contact your health care provider or seek immediate medical care if there is a change in your condition. WHAT ARE THE TYPES OF HEAD INJURIES? Head injuries can be as minor as a bump. Some head injuries can be more severe. More severe head injuries include:  A jarring injury to the brain (concussion).  A bruise of the brain (contusion). This mean there is bleeding in the brain that can cause swelling.  A cracked skull (skull fracture).  Bleeding in the brain that collects, clots, and forms a bump (hematoma). WHAT CAUSES A HEAD INJURY? A serious head injury is most likely to happen to someone who is in a car wreck and is not wearing a seat belt. Other causes of major head injuries include bicycle or motorcycle accidents, sports injuries, and falls. HOW ARE HEAD INJURIES DIAGNOSED? A complete history of the event leading to the injury and your current symptoms will be helpful in diagnosing head injuries. Many times, pictures of the brain, such as CT or MRI are needed to see the extent of the injury. Often, an overnight hospital stay is necessary for observation.  WHEN SHOULD I SEEK IMMEDIATE MEDICAL CARE?  You should get help right away if:  You have confusion or drowsiness.  You feel sick to your stomach (nauseous) or  have continued, forceful vomiting.  You have dizziness or unsteadiness that is getting worse.  You have severe, continued headaches not relieved by medicine. Only take over-the-counter or prescription medicines for pain, fever, or discomfort as directed by your health care provider.  You do not have normal function of the arms or legs or are unable to walk.  You notice changes in the black spots in the center of the colored part of your eye (pupil).  You have a clear or bloody fluid coming from your nose or ears.  You have a loss of vision. During the next 24 hours after the injury, you must stay with someone who can watch you for the warning signs. This person should contact local emergency services (911 in the [place] if you have seizures, you become unconscious, or you are unable to wake up. HOW CAN I PREVENT A HEAD INJURY IN THE FUTURE? The most important factor for preventing major head injuries is avoiding motor vehicle accidents. To minimize the potential for damage to your head, it is crucial to wear seat belts while riding in motor vehicles. Wearing helmets while bike riding and playing collision sports (like football) is also helpful. Also, avoiding dangerous activities around the house will further help reduce your risk of head injury.  WHEN CAN I RETURN TO NORMAL ACTIVITIES AND ATHLETICS? You should be reevaluated by your health care provider before returning to these activities. If you have any of the following symptoms, you should not return to activities or contact  sports until 1 week after the symptoms have stopped:  Persistent headache.  Dizziness or vertigo.  Poor attention and concentration.  Confusion.  Memory problems.  Nausea or vomiting.  Fatigue or tire easily.  Irritability.  Intolerant of bright lights or loud noises.  Anxiety or depression.  Disturbed sleep. MAKE SURE YOU:   Understand these instructions.  Will watch your condition.  Will get  help right away if you are not doing well or get worse.   This information is not intended to replace advice given to you by your health care provider. Make sure you discuss any questions you have with your health care provider.   Document Released: 03/31/2005 Document Revised: 04/21/2014 Document Reviewed: 12/06/2012 [employer] Interactive Patient Education [employer]2016 [employer]

## 2015-04-06 NOTE — ED Notes (Signed)
PER EMS: pt from [facility], sent here due to unwitnessed fall, found on floor next to refrigerator sitting on his bottom leaning against the wall. Pt has dementia and is alert at baseline. Pt denies LOC. Staff stated it appeared as if he was attempting to get ice from the freezer and the freezer door swung back and hit him in the right side of the back of the head causing a small laceration and hematoma, scant bleeding. Pt is on Eliquis. BP-126/92, HR-86, CBG-150.

## 2015-04-06 NOTE — ED Provider Notes (Signed)
CSN: [id number]     Arrival date & time 04/06/15  1909 History   First MD Initiated Contact with Patient 04/06/15 1922     Chief Complaint  Patient presents with  . Fall     (Consider location/radiation/quality/duration/timing/severity/associated sxs/prior Treatment) HPI Comments: Level V caveat for dementia. Patient sent from nursing home after unwitnessed fall. Found at the refrigerator. Has laceration to his right posterior scalp. Unknown loss of consciousness. He is on a Eliquis history of atrial fibrillation. Patient complains of some pain under his laceration site but no other pain. Denies neck, back, chest, abdominal pain or hip pain. -Daughter confirms he is at his baseline mentation.   The history is provided by the patient and the EMS personnel. The history is limited by the condition of the patient.    Past Medical History  Diagnosis Date  . Controlled atrial fibrillation (HCC)   . IHD (ischemic heart disease)   . Arthritis     right knee  . Hyperlipidemia   . Hypertension   . Anomalous coronary artery origin   . CAD (coronary artery disease)    Past Surgical History  Procedure Laterality Date  . Coronary artery bypass graft    . Cataract implants     Family History  Problem Relation Age of Onset  . Colon cancer Mother   . Heart disease Father    Social History  Substance Use Topics  . Smoking status: Never Smoker   . Smokeless tobacco: Never Used  . Alcohol Use: No    Review of Systems  Unable to perform ROS: Dementia  Constitutional: Negative for fever, activity change and appetite change.  Respiratory: Negative for cough, chest tightness and shortness of breath.       Allergies  Lipitor; Mevacor; Penicillins; Pravachol; and Sulfa drugs cross reactors  Home Medications   Prior to Admission medications   Medication Sig Start Date End Date Taking? Authorizing Provider  apixaban (ELIQUIS) 5 MG TABS tablet Take 1 tablet (5 mg total) by mouth 2 (two)  times daily. 01/09/14  Yes Peter M [place], MD  b complex-vitamin c-folic acid (NEPHRO-VITE) 0.8 MG TABS tablet Take 1 tablet by mouth at bedtime.   Yes Historical Provider, MD  furosemide (LASIX) 40 MG tablet Take 1 tablet (40 mg total) by mouth daily as needed. 11/18/13  Yes Peter M [place], MD  metoprolol succinate (TOPROL-XL) 100 MG 24 hr tablet Take 100 mg by mouth 2 (two) times daily. Take with or immediately following a meal. Take every day per [facility]   Yes Historical Provider, MD  potassium chloride SA (K-DUR,KLOR-CON) 20 MEQ tablet TAKE 1 TABLET BY MOUTH TWICE DAILY 04/04/14  Yes Peter M [place], MD  traMADol (ULTRAM) 50 MG tablet Take 50 mg by mouth every 6 (six) hours as needed for moderate pain.   Yes Historical Provider, MD  triamcinolone cream (KENALOG) 0.1 % Apply 1 application topically 2 (two) times daily as needed. For rash   Yes Historical Provider, MD  ezetimibe (ZETIA) 10 MG tablet Take 1 tablet (10 mg total) by mouth daily. Patient not taking: Reported on 04/06/2015 11/18/13   Peter M [place], MD  metoprolol succinate (TOPROL-XL) 50 MG 24 hr tablet 2 IN THE AM, 1 IN THE PM Patient not taking: Reported on 04/06/2015 11/18/13   Peter M [place], MD  rosuvastatin (CRESTOR) 5 MG tablet Take 1 tablet (5 mg total) by mouth daily. Patient not taking: Reported on 04/06/2015 11/18/13   Peter M [place], MD  BP 135/75 mmHg  Pulse 71  Temp(Src) 97.9 F (36.6 C) (Oral)  Resp 18  SpO2 94% Physical Exam  Constitutional: He is oriented to person, place, and time. He appears well-developed and well-nourished. No distress.  HENT:  Head: Normocephalic and atraumatic.  Mouth/Throat: Oropharynx is clear and moist. No oropharyngeal exudate.  2 cm horizontal laceration to the right occiput. Bleeding controlled.  Eyes: Conjunctivae and EOM are normal. Pupils are equal, round, and reactive to light.  Neck: Normal range of motion. Neck supple.  No C-spine tenderness  Cardiovascular: Normal rate, regular  rhythm, normal heart sounds and intact distal pulses.   No murmur heard. Pulmonary/Chest: Effort normal and breath sounds normal. No respiratory distress.  Abdominal: Soft. There is no tenderness. There is no rebound and no guarding.  Musculoskeletal: Normal range of motion. He exhibits no edema or tenderness.  No T or L spine tenderness FROM hips without pain  Neurological: He is alert and oriented to person, place, and time. No cranial nerve deficit. He exhibits normal muscle tone. Coordination normal.  No ataxia on finger to nose bilaterally. No pronator drift. 5/5 strength throughout. CN 2-12 intact.Equal grip strength. Sensation intact.   Skin: Skin is warm.  Psychiatric: He has a normal mood and affect. His behavior is normal.  Nursing note and vitals reviewed.   ED Course  Procedures (including critical care time) Labs Review Labs Reviewed - No data to display  Imaging Review Dg Chest 2 View  04/06/2015  CLINICAL DATA:  PER EMS: pt from [facility], sent here due to unwitnessed fall, found on floor next to refrigerator sitting on his bottom leaning against the wall. Pt has dementia and is alert at baseline. Pt denies LOC. EXAM: CHEST - 2 VIEW COMPARISON:  None available FINDINGS: Previous CABG. Mild cardiomegaly. Coarse interstitial and airspace opacities in posterior left lower lobe. Mild interstitial prominence at the right lung base. No effusion. No pneumothorax. Prominent spurring in the mid thoracic spine. IMPRESSION: 1. Coarse left lower lobe interstitial and airspace opacities of uncertain chronicity. 2. Mild cardiomegaly, post CABG. Electronically Signed   By: Corlis Leak  Hassell M.D.   On: 04/06/2015 20:06   Ct Head Wo Contrast  04/06/2015  CLINICAL DATA:  Unwitnessed fall in the nursing facility. Occipital laceration and dementia. EXAM: CT HEAD WITHOUT CONTRAST CT CERVICAL SPINE WITHOUT CONTRAST TECHNIQUE: Multidetector CT imaging of the head and cervical spine was  performed following the standard protocol without intravenous contrast. Multiplanar CT image reconstructions of the cervical spine were also generated. COMPARISON:  None. FINDINGS: CT HEAD FINDINGS There is chronic diffuse atrophy. Chronic bilateral periventricular white matter small vessel ischemic change is identified. There is no midline shift, hydrocephalus, or mass. No acute hemorrhage or acute transcortical infarct is noted. The bony calvarium is intact. The visualized sinuses are clear. CT CERVICAL SPINE FINDINGS There is no acute fracture or dislocation. There are extensive degenerative joint changes of cervical spine with narrowed joint space and osteophyte formation. The prevertebral soft tissues are normal. The visualized lung apices are clear. IMPRESSION: Chronic diffuse atrophy. Chronic bilateral periventricular white matter small vessel ischemic change. No focal acute intracranial abnormality identified. No acute fracture or dislocation of cervical spine. Degenerative joint changes of cervical spine. Electronically Signed   By: Sherian ReinWei-Chen  Lin M.D.   On: 04/06/2015 21:14   Ct Cervical Spine Wo Contrast  04/06/2015  CLINICAL DATA:  Unwitnessed fall in the nursing facility. Occipital laceration and dementia. EXAM: CT HEAD WITHOUT  CONTRAST CT CERVICAL SPINE WITHOUT CONTRAST TECHNIQUE: Multidetector CT imaging of the head and cervical spine was performed following the standard protocol without intravenous contrast. Multiplanar CT image reconstructions of the cervical spine were also generated. COMPARISON:  None. FINDINGS: CT HEAD FINDINGS There is chronic diffuse atrophy. Chronic bilateral periventricular white matter small vessel ischemic change is identified. There is no midline shift, hydrocephalus, or mass. No acute hemorrhage or acute transcortical infarct is noted. The bony calvarium is intact. The visualized sinuses are clear. CT CERVICAL SPINE FINDINGS There is no acute fracture or dislocation.  There are extensive degenerative joint changes of cervical spine with narrowed joint space and osteophyte formation. The prevertebral soft tissues are normal. The visualized lung apices are clear. IMPRESSION: Chronic diffuse atrophy. Chronic bilateral periventricular white matter small vessel ischemic change. No focal acute intracranial abnormality identified. No acute fracture or dislocation of cervical spine. Degenerative joint changes of cervical spine. Electronically Signed   By: Sherian Rein M.D.   On: 04/06/2015 21:14   I have personally reviewed and evaluated these images and lab results as part of my medical decision-making.   EKG Interpretation   Date/Time:  Friday April 06 2015 22:32:09 EST Ventricular Rate:  74 PR Interval:    QRS Duration: 120 QT Interval:  423 QTC Calculation: 469 R Axis:   -58 Text Interpretation:  Atrial fibrillation Incomplete left bundle branch  block Inferior infarct, old Consider anterior infarct No previous ECGs  available Confirmed by Francena Zender  MD, Dmitry Macomber [phone]) on 04/06/2015 10:40:14  PM      MDM   Final diagnoses:  Head injury, initial encounter  Laceration   Unwitnessed fall with head laceration on anticoagulation. Neurologically intact. No chest pain or shortness of breath. At mental status baseline per daughter at bedside.  CT head and C-spine negative for acute pathology. Laceration repaired by Manuela Neptune. Tetanus updated.  Patient tolerating by mouth and ambulatory. Discussed slight risk of delayed head bleeding due to anticoagulation with patient and daughter. Advised follow-up with PCP in one week for suture and staple removal. Return to the ED sooner with worsening headache, vomiting, mental status change or any other concerns.    Glynn Octave, MD 04/06/15 [phone]

## 2015-06-13 DEATH — deceased

## 2017-02-21 IMAGING — CT CT CERVICAL SPINE W/O CM
2 series · 10 of 14 positions shown, 12 images · non-contrast
Comparison: None.

CLINICAL DATA: Unwitnessed fall in the nursing facility. Occipital
laceration and dementia.

EXAM:
CT HEAD WITHOUT CONTRAST
CT CERVICAL SPINE WITHOUT CONTRAST
TECHNIQUE: Multidetector CT imaging of the head and cervical spine was
performed following the standard protocol without intravenous
contrast. Multiplanar CT image reconstructions of the cervical spine
were also generated.

[Series 2: head 5.0 h30s · axial · 0.43mm/px · z∈[-98,-48]mm · 2 of 31 slices shown]
[im 11/31  bone]
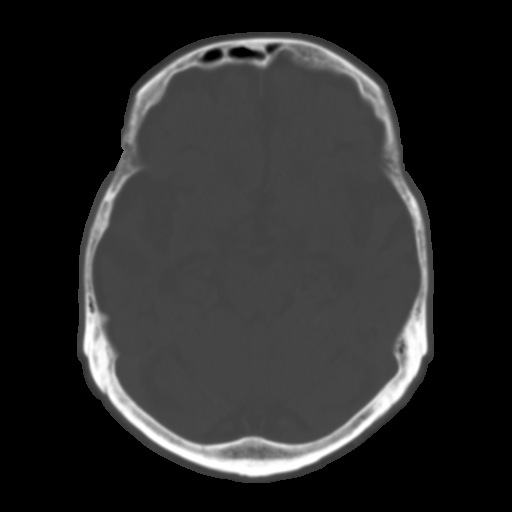
[im 21/31  bone]
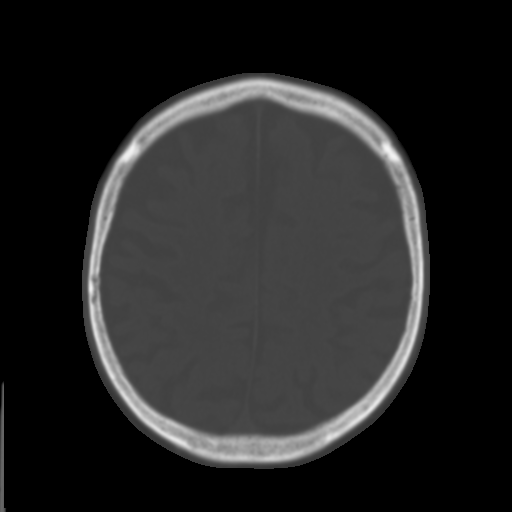

[Series 3: head 2.0 h70h · axial · 0.43mm/px · z∈[-132,-12]mm · 8 of 78 slices shown, 10 images]
[im 9/78  soft-tissue]
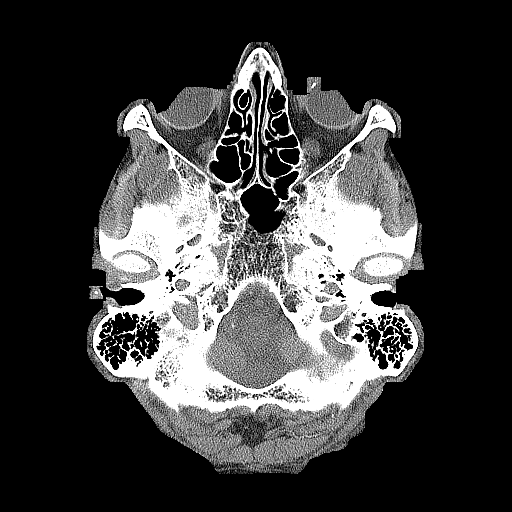
[im 9/78  bone]
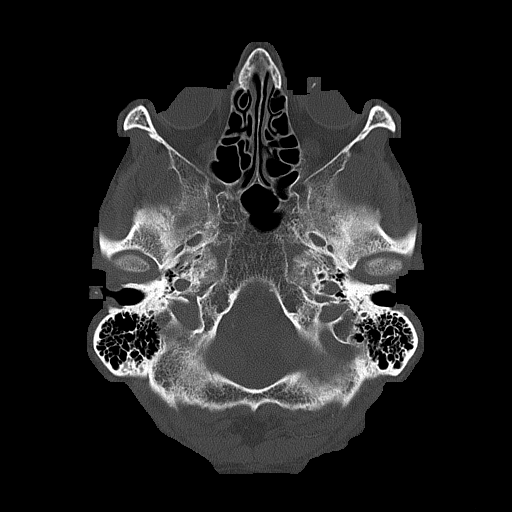
[im 18/78  bone]
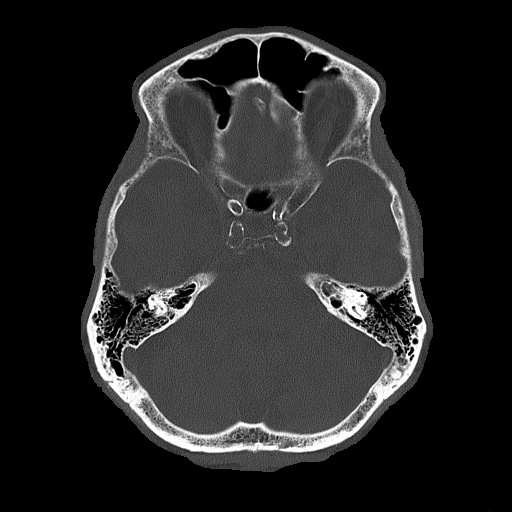
[im 26/78  bone]
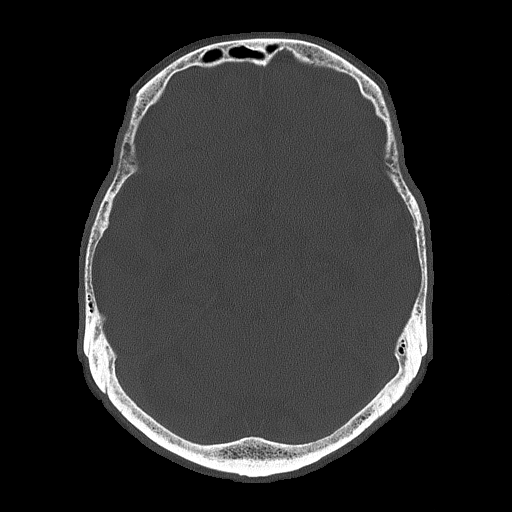
[im 35/78  bone]
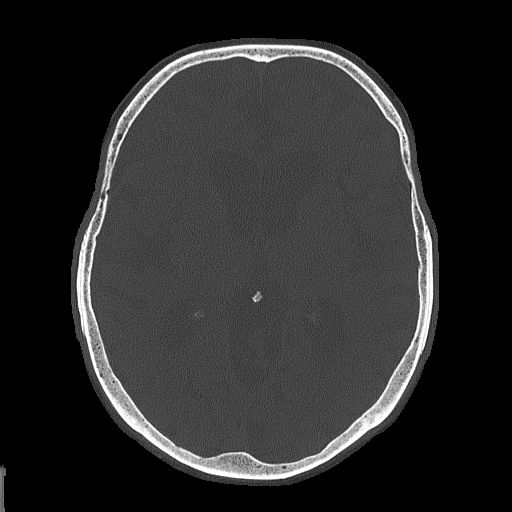
[im 43/78  soft-tissue]
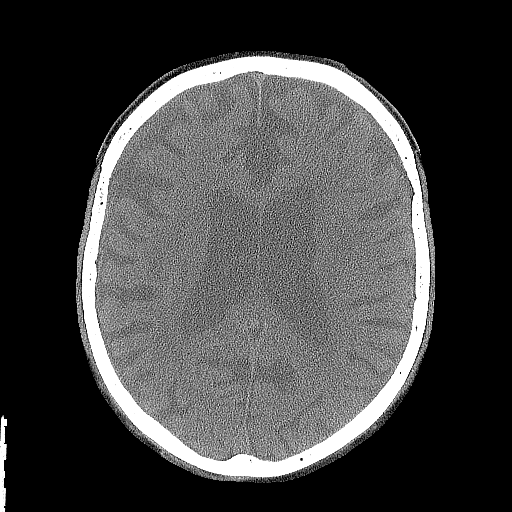
[im 43/78  bone]
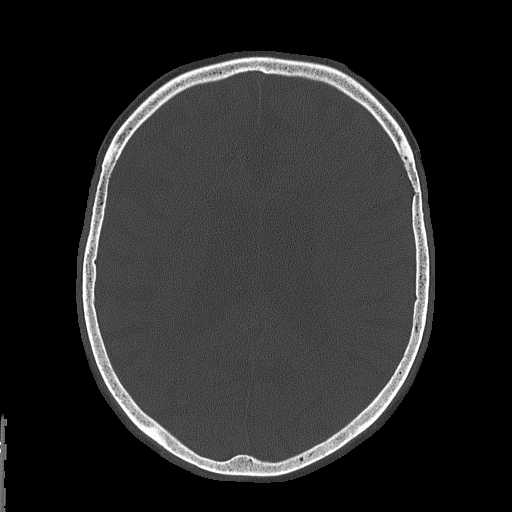
[im 52/78  bone]
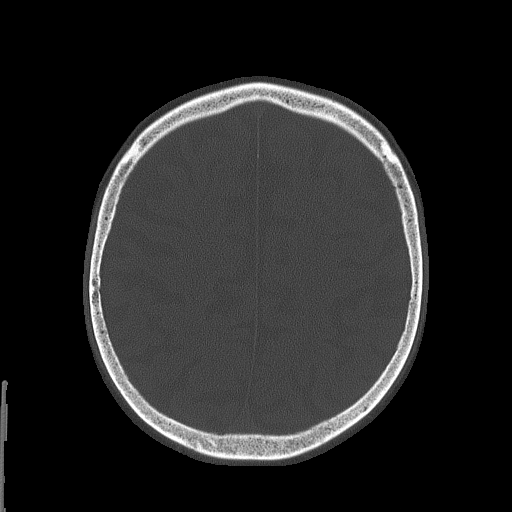
[im 60/78  bone]
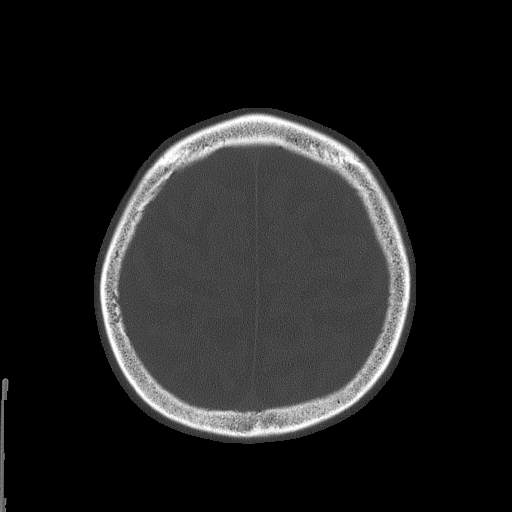
[im 69/78  bone]
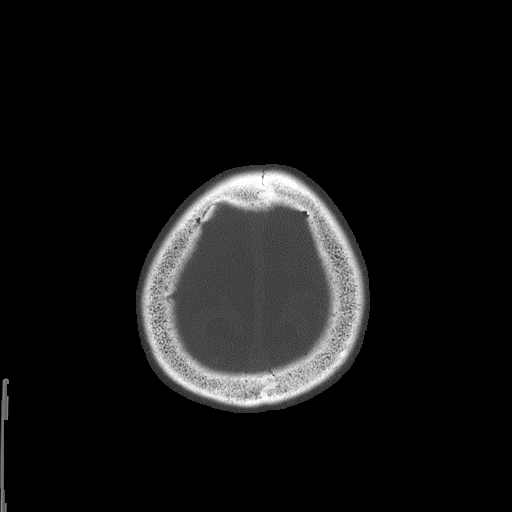

[10 of 14 positions shown; findings below may reference images not displayed]

FINDINGS: CT HEAD FINDINGS

There is chronic diffuse atrophy. Chronic bilateral periventricular
white matter small vessel ischemic change is identified. There is no
midline shift, hydrocephalus, or mass. No acute hemorrhage or acute
transcortical infarct is noted. The bony calvarium is intact. The
visualized sinuses are clear.

CT CERVICAL SPINE FINDINGS

There is no acute fracture or dislocation. There are extensive
degenerative joint changes of cervical spine with narrowed joint
space and osteophyte formation. The prevertebral soft tissues are
normal. The visualized lung apices are clear.
IMPRESSION: Chronic diffuse atrophy. Chronic bilateral periventricular white
matter small vessel ischemic change.

No focal acute intracranial abnormality identified.

No acute fracture or dislocation of cervical spine.

Degenerative joint changes of cervical spine.
